# Patient Record
Sex: Male | Born: 1970 | Race: White | Hispanic: No | Marital: Married | Smoking: Never smoker
Health system: Southern US, Community
[De-identification: ages and names within clinical notes are randomized; demographics above are authoritative.]

## PROBLEM LIST (undated history)

## (undated) DIAGNOSIS — Z8719 Personal history of other diseases of the digestive system: Secondary | ICD-10-CM

## (undated) DIAGNOSIS — S83209A Unspecified tear of unspecified meniscus, current injury, unspecified knee, initial encounter: Secondary | ICD-10-CM

## (undated) DIAGNOSIS — Z87442 Personal history of urinary calculi: Secondary | ICD-10-CM

---
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
## 2010-11-09 ENCOUNTER — Emergency Department (HOSPITAL_COMMUNITY)
Admission: EM | Admit: 2010-11-09 | Discharge: 2010-11-09 | Disposition: A | Discharge status: Home / Self Care | Payer: Managed Care, Other (non HMO) | Attending: Emergency Medicine | Admitting: Emergency Medicine

## 2010-11-09 ENCOUNTER — Emergency Department (HOSPITAL_COMMUNITY): Payer: Managed Care, Other (non HMO)

## 2010-11-09 DIAGNOSIS — Z87442 Personal history of urinary calculi: Secondary | ICD-10-CM | POA: Insufficient documentation

## 2010-11-09 DIAGNOSIS — R109 Unspecified abdominal pain: Secondary | ICD-10-CM | POA: Insufficient documentation

## 2010-11-09 DIAGNOSIS — N200 Calculus of kidney: Secondary | ICD-10-CM | POA: Insufficient documentation

## 2010-11-09 DIAGNOSIS — R110 Nausea: Secondary | ICD-10-CM | POA: Insufficient documentation

## 2010-11-09 DIAGNOSIS — N201 Calculus of ureter: Secondary | ICD-10-CM | POA: Insufficient documentation

## 2010-11-09 LAB — URINALYSIS, ROUTINE W REFLEX MICROSCOPIC
Bilirubin Urine: NEGATIVE
Glucose, UA: NEGATIVE mg/dL
Hgb urine dipstick: NEGATIVE
Ketones, ur: NEGATIVE mg/dL
Nitrite: NEGATIVE
Protein, ur: NEGATIVE mg/dL
Specific Gravity, Urine: 1.027 (ref 1.005–1.030)
Urobilinogen, UA: 1 mg/dL (ref 0.0–1.0)
pH: 7.5 (ref 5.0–8.0)

## 2010-11-09 LAB — POCT I-STAT, CHEM 8
BUN: 22 mg/dL (ref 6–23)
Calcium, Ion: 1.18 mmol/L (ref 1.12–1.32)
Chloride: 105 meq/L (ref 96–112)
Creatinine, Ser: 1.5 mg/dL (ref 0.4–1.5)
Glucose, Bld: 89 mg/dL (ref 70–99)
HCT: 48 % (ref 39.0–52.0)
Hemoglobin: 16.3 g/dL (ref 13.0–17.0)
Potassium: 3.6 mEq/L (ref 3.5–5.1)
Sodium: 143 meq/L (ref 135–145)
TCO2: 26 mmol/L (ref 0–100)

## 2012-10-06 ENCOUNTER — Other Ambulatory Visit: Payer: Self-pay | Admitting: Surgical

## 2012-10-09 NOTE — H&P (Signed)
Robert Kline is an 42 y.o. male.   Chief Complaint: left knee pain HPI: The patient presented with left knee pain x 2 months. He reports that he injured it when he was [place] and felt his knee buckle on him. Since that time he's had pain with weight bearing, swelling, limited ROM and instability in the knee.He was initally treated with an economy hinge brace. He reports that he's not feeling much better unless he's sitting down and has the leg elevated. He's noticed some fluid on the knee. He says that when he puts weight on it, it does feel unstable like it might hyperextend. MRI showed he has a retear of his medial meniscus of the left knee.  Allergies No Known Drug Allergies.   Family History( Diabetes Mellitus. mother and grandmother mothers side Heart Disease. mother Heart disease in male family member before age 73 Hypertension. mother and grandmother mothers side Rheumatoid Arthritis. grandmother mothers side   Social History Alcohol use. current drinker; drinks beer, wine and hard liquor; only occasionally per week Children. 3 Exercise. Exercises rarely; does running / walking and other Illicit drug use. no Living situation. live with spouse Marital status. married Tobacco / smoke exposure. no Tobacco use. never smoker   Medication History No Current Medications.   Past Surgical History Arthroscopy of Knee. left  Past Medical History Kidney Stone   Review of Systems  Constitutional: Negative.   HENT: Negative.  Negative for neck pain.   Eyes: Negative.   Respiratory: Negative.   Cardiovascular: Negative.   Gastrointestinal: Negative.   Genitourinary: Negative.   Musculoskeletal: Positive for joint pain. Negative for myalgias, back pain and falls.       Left knee pain  Skin: Negative.   Neurological: Negative.   Endo/Heme/Allergies: Negative.   Psychiatric/Behavioral: Negative.     Physical Exam   Constitutional: He is oriented to person, place, and time. He appears well-developed and well-nourished. No distress.  HENT:  Head: Normocephalic and atraumatic.  Right Ear: External ear normal.  Left Ear: External ear normal.  Nose: Nose normal.  Mouth/Throat: Oropharynx is clear and moist.  Eyes: Conjunctivae and EOM are normal.  Neck: Normal range of motion. Neck supple. No tracheal deviation present. No thyromegaly present.  Cardiovascular: Normal rate, regular rhythm, normal heart sounds and intact distal pulses.   No murmur heard. Respiratory: Effort normal and breath sounds normal. No respiratory distress. He has no wheezes. He exhibits no tenderness.  GI: Soft. Bowel sounds are normal. He exhibits no distension and no mass. There is no tenderness.  Musculoskeletal:       Right hip: Normal.       Left hip: Normal.       Right knee: Normal.       Left knee: He exhibits decreased range of motion and effusion. He exhibits no erythema. Tenderness found. Medial joint line tenderness noted.       Right lower leg: He exhibits no tenderness and no swelling.       Left lower leg: He exhibits no tenderness and no swelling.  In the left knee, he does have a mild effusion. He is tender about the medial joint line and over the course of the LCL. He does have pain with McMurray's. On ROM, he can fully extend, however this is painful. He is hesitant to flex beyond 85 degrees. The calf is soft and nontender.  Lymphadenopathy:    He has no cervical adenopathy.  Neurological: He  is alert and oriented to person, place, and time. He has normal strength and normal reflexes. No sensory deficit.  Skin: No rash noted. He is not diaphoretic. No erythema.  Psychiatric: He has a normal mood and affect. His behavior is normal.     Assessment/Plan Medial meniscus tear, left knee He needs an arthroscopic medial meniscectomy of the left knee. At this point, the most predictable means of improving pain  and function is knee arthroscopy. The procedure, risks, potential complications and rehab course are discussed in detail and the patient elects to proceed. The goals of this procedure are decreased pain and increased function. There is a high liklihood that both of these goals will be achieved.   [name] LAUREN 10/09/2012, 11:29 AM

## 2012-10-11 ENCOUNTER — Encounter (HOSPITAL_BASED_OUTPATIENT_CLINIC_OR_DEPARTMENT_OTHER): Payer: Self-pay | Admitting: *Deleted

## 2012-10-11 NOTE — Progress Notes (Signed)
NPO AFTER MN. ARRIVES AT 1030. NEEDS HG. 

## 2012-10-15 ENCOUNTER — Ambulatory Visit (HOSPITAL_BASED_OUTPATIENT_CLINIC_OR_DEPARTMENT_OTHER): Payer: BC Managed Care – PPO | Admitting: Anesthesiology

## 2012-10-15 ENCOUNTER — Encounter (HOSPITAL_BASED_OUTPATIENT_CLINIC_OR_DEPARTMENT_OTHER)
Admission: RE | Disposition: A | Discharge status: Home / Self Care | Payer: Self-pay | Source: Ambulatory Visit | Attending: Orthopedic Surgery

## 2012-10-15 ENCOUNTER — Ambulatory Visit (HOSPITAL_BASED_OUTPATIENT_CLINIC_OR_DEPARTMENT_OTHER)
Admission: RE | Admit: 2012-10-15 | Discharge: 2012-10-15 | Disposition: A | Discharge status: Home / Self Care | Payer: BC Managed Care – PPO | Source: Ambulatory Visit | Attending: Orthopedic Surgery | Admitting: Orthopedic Surgery

## 2012-10-15 ENCOUNTER — Encounter (HOSPITAL_BASED_OUTPATIENT_CLINIC_OR_DEPARTMENT_OTHER): Payer: Self-pay | Admitting: *Deleted

## 2012-10-15 ENCOUNTER — Encounter (HOSPITAL_BASED_OUTPATIENT_CLINIC_OR_DEPARTMENT_OTHER): Payer: Self-pay | Admitting: Anesthesiology

## 2012-10-15 DIAGNOSIS — IMO0002 Reserved for concepts with insufficient information to code with codable children: Secondary | ICD-10-CM | POA: Insufficient documentation

## 2012-10-15 DIAGNOSIS — X58XXXA Exposure to other specified factors, initial encounter: Secondary | ICD-10-CM | POA: Insufficient documentation

## 2012-10-15 DIAGNOSIS — M2240 Chondromalacia patellae, unspecified knee: Secondary | ICD-10-CM | POA: Insufficient documentation

## 2012-10-15 HISTORY — DX: Unspecified tear of unspecified meniscus, current injury, unspecified knee, initial encounter: S83.209A

## 2012-10-15 HISTORY — DX: Personal history of other diseases of the digestive system: Z87.19

## 2012-10-15 HISTORY — PX: KNEE ARTHROSCOPY WITH MEDIAL MENISECTOMY: SHX5651

## 2012-10-15 HISTORY — DX: Personal history of urinary calculi: Z87.442

## 2012-10-15 SURGERY — ARTHROSCOPY, KNEE, WITH MEDIAL MENISCECTOMY
Anesthesia: General | Site: Knee | Laterality: Left | Wound class: Clean

## 2012-10-15 MED ORDER — LACTATED RINGERS IV SOLN
INTRAVENOUS | Status: DC
  Filled 2012-10-15: qty 1000

## 2012-10-15 MED ORDER — PROPOFOL 10 MG/ML IV BOLUS
INTRAVENOUS | Status: DC | PRN
  Administered 2012-10-15: 350 mg via INTRAVENOUS

## 2012-10-15 MED ORDER — ACETAMINOPHEN 10 MG/ML IV SOLN
INTRAVENOUS | Status: DC | PRN
  Administered 2012-10-15: 1000 mg via INTRAVENOUS

## 2012-10-15 MED ORDER — CEFAZOLIN SODIUM-DEXTROSE 2-3 GM-% IV SOLR
2.0000 g | INTRAVENOUS | Status: AC
  Administered 2012-10-15: 2 g via INTRAVENOUS
  Filled 2012-10-15: qty 50

## 2012-10-15 MED ORDER — LIDOCAINE HCL (CARDIAC) 20 MG/ML IV SOLN
INTRAVENOUS | Status: DC | PRN
  Administered 2012-10-15: 80 mg via INTRAVENOUS

## 2012-10-15 MED ORDER — LACTATED RINGERS IV SOLN
INTRAVENOUS | Status: DC
  Administered 2012-10-15 (×2): via INTRAVENOUS
  Filled 2012-10-15: qty 1000

## 2012-10-15 MED ORDER — PROMETHAZINE HCL 25 MG/ML IJ SOLN
6.2500 mg | INTRAMUSCULAR | Status: DC | PRN
  Filled 2012-10-15: qty 1

## 2012-10-15 MED ORDER — ONDANSETRON HCL 4 MG/2ML IJ SOLN
INTRAMUSCULAR | Status: DC | PRN
  Administered 2012-10-15: 4 mg via INTRAVENOUS

## 2012-10-15 MED ORDER — DEXAMETHASONE SODIUM PHOSPHATE 4 MG/ML IJ SOLN
INTRAMUSCULAR | Status: DC | PRN
  Administered 2012-10-15: 10 mg via INTRAVENOUS

## 2012-10-15 MED ORDER — MEPERIDINE HCL 25 MG/ML IJ SOLN
6.2500 mg | INTRAMUSCULAR | Status: DC | PRN
  Filled 2012-10-15: qty 1

## 2012-10-15 MED ORDER — SODIUM CHLORIDE 0.9 % IR SOLN
Status: DC | PRN
  Administered 2012-10-15: 6000 mL

## 2012-10-15 MED ORDER — MIDAZOLAM HCL 5 MG/5ML IJ SOLN
INTRAMUSCULAR | Status: DC | PRN
  Administered 2012-10-15: 2 mg via INTRAVENOUS

## 2012-10-15 MED ORDER — KETOROLAC TROMETHAMINE 30 MG/ML IJ SOLN
INTRAMUSCULAR | Status: DC | PRN
  Administered 2012-10-15: 30 mg via INTRAVENOUS

## 2012-10-15 MED ORDER — BUPIVACAINE-EPINEPHRINE 0.25% -1:200000 IJ SOLN
INTRAMUSCULAR | Status: DC | PRN
  Administered 2012-10-15: 30 mL

## 2012-10-15 MED ORDER — FENTANYL CITRATE 0.05 MG/ML IJ SOLN
25.0000 ug | INTRAMUSCULAR | Status: DC | PRN
  Filled 2012-10-15: qty 1

## 2012-10-15 MED ORDER — BACITRACIN-NEOMYCIN-POLYMYXIN 400-5-5000 EX OINT
TOPICAL_OINTMENT | CUTANEOUS | Status: DC | PRN
  Administered 2012-10-15: 1 via TOPICAL

## 2012-10-15 MED ORDER — OXYCODONE-ACETAMINOPHEN 10-325 MG PO TABS: 1.0000 | ORAL_TABLET | ORAL | Status: AC | PRN

## 2012-10-15 MED ORDER — FENTANYL CITRATE 0.05 MG/ML IJ SOLN
INTRAMUSCULAR | Status: DC | PRN
  Administered 2012-10-15 (×3): 50 ug via INTRAVENOUS

## 2012-10-15 MED ORDER — CHLORHEXIDINE GLUCONATE 4 % EX LIQD
60.0000 mL | Freq: Once | CUTANEOUS | Status: DC
  Filled 2012-10-15: qty 60

## 2012-10-15 SURGICAL SUPPLY — 41 items
BANDAGE ELASTIC 4 VELCRO ST LF (GAUZE/BANDAGES/DRESSINGS) ×2 IMPLANT
BANDAGE ELASTIC 6 VELCRO ST LF (GAUZE/BANDAGES/DRESSINGS) ×2 IMPLANT
BLADE CUDA 5.5 (BLADE) IMPLANT
BLADE CUTTER GATOR 3.5 (BLADE) IMPLANT
BLADE CUTTER MENIS 5.5 (BLADE) IMPLANT
BLADE GREAT WHITE 4.2 (BLADE) ×2 IMPLANT
BLADE SURG 15 STRL LF DISP TIS (BLADE) IMPLANT
BLADE SURG 15 STRL SS (BLADE)
BNDG COHESIVE 4X5 TAN NS LF (GAUZE/BANDAGES/DRESSINGS) ×2 IMPLANT
CANISTER SUCT LVC 12 LTR MEDI- (MISCELLANEOUS) ×2 IMPLANT
CANISTER SUCTION 2500CC (MISCELLANEOUS) ×2 IMPLANT
CLOTH BEACON ORANGE TIMEOUT ST (SAFETY) ×2 IMPLANT
DRAPE ARTHROSCOPY W/POUCH 114 (DRAPES) ×2 IMPLANT
DRAPE LG THREE QUARTER DISP (DRAPES) ×2 IMPLANT
DRSG EMULSION OIL 3X3 NADH (GAUZE/BANDAGES/DRESSINGS) ×2 IMPLANT
DRSG PAD ABDOMINAL 8X10 ST (GAUZE/BANDAGES/DRESSINGS) ×2 IMPLANT
DURAPREP 26ML APPLICATOR (WOUND CARE) ×2 IMPLANT
ELECT MENISCUS 165MM 90D (ELECTRODE) IMPLANT
ELECT REM PT RETURN 9FT ADLT (ELECTROSURGICAL)
ELECTRODE REM PT RTRN 9FT ADLT (ELECTROSURGICAL) IMPLANT
GAUZE SPONGE 4X4 12PLY STRL LF (GAUZE/BANDAGES/DRESSINGS) ×2 IMPLANT
GLOVE ECLIPSE 8.0 STRL XLNG CF (GLOVE) ×2 IMPLANT
GLOVE INDICATOR 8.5 STRL (GLOVE) ×4 IMPLANT
GLOVE SURG ORTHO 6.0 STRL STRW (GLOVE) ×2 IMPLANT
GOWN PREVENTION PLUS LG XLONG (DISPOSABLE) ×2 IMPLANT
GOWN STRL REIN XL XLG (GOWN DISPOSABLE) ×2 IMPLANT
KNEE WRAP E Z 3 GEL PACK (MISCELLANEOUS) ×2 IMPLANT
MINI VAC (SURGICAL WAND) ×2 IMPLANT
PACK ARTHROSCOPY DSU (CUSTOM PROCEDURE TRAY) ×2 IMPLANT
PACK BASIN DAY SURGERY FS (CUSTOM PROCEDURE TRAY) ×2 IMPLANT
PADDING CAST COTTON 6X4 STRL (CAST SUPPLIES) ×2 IMPLANT
PENCIL BUTTON HOLSTER BLD 10FT (ELECTRODE) IMPLANT
SET ARTHROSCOPY TUBING (MISCELLANEOUS) ×1
SET ARTHROSCOPY TUBING LN (MISCELLANEOUS) ×1 IMPLANT
SPONGE GAUZE 4X4 12PLY (GAUZE/BANDAGES/DRESSINGS) ×2 IMPLANT
SPONGE SURGIFOAM ABS GEL 12-7 (HEMOSTASIS) IMPLANT
SUT ETHILON 3 0 PS 1 (SUTURE) ×2 IMPLANT
SYR CONTROL 10ML LL (SYRINGE) IMPLANT
TOWEL OR 17X24 6PK STRL BLUE (TOWEL DISPOSABLE) ×2 IMPLANT
WAND 90 DEG TURBOVAC W/CORD (SURGICAL WAND) IMPLANT
WATER STERILE IRR 500ML POUR (IV SOLUTION) ×2 IMPLANT

## 2012-10-15 NOTE — Anesthesia Preprocedure Evaluation (Addendum)
Anesthesia Evaluation  Patient identified by MRN, date of birth, ID band Patient awake    Reviewed: Allergy & Precautions, H&P , NPO status , Patient's Chart, lab work & pertinent test results  Airway Mallampati: II TM Distance: >3 FB Neck ROM: full    Dental no notable dental hx.    Pulmonary neg pulmonary ROS,  breath sounds clear to auscultation  Pulmonary exam normal       Cardiovascular Exercise Tolerance: Good negative cardio ROS  Rhythm:regular Rate:Normal     Neuro/Psych negative neurological ROS  negative psych ROS   GI/Hepatic negative GI ROS, Neg liver ROS,   Endo/Other  negative endocrine ROS  Renal/GU negative Renal ROS  negative genitourinary   Musculoskeletal   Abdominal   Peds  Hematology negative hematology ROS (+)   Anesthesia Other Findings   Reproductive/Obstetrics negative OB ROS                           Anesthesia Physical Anesthesia Plan  ASA: II  Anesthesia Plan: General and General LMA   Post-op Pain Management:    Induction:   Airway Management Planned:   Additional Equipment:   Intra-op Plan:   Post-operative Plan:   Informed Consent: I have reviewed the patients History and Physical, chart, labs and discussed the procedure including the risks, benefits and alternatives for the proposed anesthesia with the patient or authorized representative who has indicated his/her understanding and acceptance.   Dental Advisory Given  Plan Discussed with: CRNA  Anesthesia Plan Comments:         Anesthesia Quick Evaluation

## 2012-10-15 NOTE — H&P (View-Only) (Signed)
NPO AFTER MN. ARRIVES AT 1030. NEEDS HG. 

## 2012-10-15 NOTE — Brief Op Note (Signed)
10/15/2012  2:02 PM  PATIENT:  Robert Kline  42 y.o. 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  PROCEDURE:  Procedure(s): KNEE ARTHROSCOPY WITH MEDIAL MENISECTOMY ABRASION CHONDROPLASTY OF PATELLA AND MEDIAL FEMORAL CHONDYLE (Left) and Microfracture Technique of Medial Femoral Condyle.  SURGEON:  Surgeon(s) and Role:    * Jacki Cones, MD - Primary  :   ASSISTANTS:OR Tech.   ANESTHESIA:   general  EBL:  Total I/O In: 1000 [I.V.:1000] Out: -   BLOOD ADMINISTERED:none  DRAINS: none   LOCAL MEDICATIONS USED:  MARCAINE 30cc of 0.25% Marcaine with Epinephrine.    SPECIMEN:  No Specimen  DISPOSITION OF SPECIMEN:  N/A  COUNTS:  YES  TOURNIQUET:  * No tourniquets in log *  DICTATION: .Other Dictation: Dictation Number [id number]  PLAN OF CARE: Discharge to home after PACU  PATIENT DISPOSITION:  PACU - hemodynamically stable.   Delay start of Pharmacological VTE agent (>24hrs) due to surgical blood loss or risk of bleeding: yes

## 2012-10-15 NOTE — Interval H&P Note (Signed)
History and Physical Interval Note:  10/15/2012 1:01 PM  Robert Kline  has presented today for surgery, with the diagnosis of Left Medial Meniscus Tear  The various methods of treatment have been discussed with the patient and family. After consideration of risks, benefits and other options for treatment, the patient has consented to  Procedure(s): KNEE ARTHROSCOPY WITH MEDIAL MENISECTOMY (Left) as a surgical intervention .  The patient's history has been reviewed, patient examined, no change in status, stable for surgery.  I have reviewed the patient's chart and labs.  Questions were answered to the patient's satisfaction.     Ali Mohl A

## 2012-10-15 NOTE — Transfer of Care (Signed)
Immediate Anesthesia Transfer of Care Note  Patient: Robert Kline  Procedure(s) Performed: Procedure(s) (LRB): KNEE ARTHROSCOPY WITH MEDIAL MENISECTOMY ABRASION CHONDROPLASTY OF PATELLA AND MEDIAL FEMORAL CHONDYLE (Left)  Patient Location: PACU  Anesthesia Type: General  Level of Consciousness: drowsy, opens eyes to command  Airway & Oxygen Therapy: Patient Spontanous Breathing and Patient connected to face mask oxygen  Post-op Assessment: Report given to PACU RN and Post -op Vital signs reviewed and stable  Post vital signs: Reviewed and stable  Complications: No apparent anesthesia complications

## 2012-10-16 NOTE — Op Note (Signed)
Robert, Kline                  ACCOUNT NO.:  [id number]  MEDICAL RECORD NO.:  [id number]  LOCATION:                                 FACILITY:  PHYSICIAN:  Georges Lynch. Hildy Nicholl, M.D.DATE OF BIRTH:  1971/04/21  DATE OF PROCEDURE:  10/15/2012 DATE OF DISCHARGE:                              OPERATIVE REPORT   SURGEON:  Georges Lynch. Alyah Boehning, M.D.  ASSISTANT:  Orthopedic operating room technician.  PREOPERATIVE DIAGNOSES: 1. Chondromalacia of the left knee. 2. Torn medial meniscus, left knee.  POSTOPERATIVE DIAGNOSES: 1. Chondromalacia of the left knee. 2. Torn medial meniscus, left knee.  OPERATION: 1. Diagnostic arthroscopy of the left knee. 2. Partial medial meniscectomy of the left knee. 3. Abrasion of chondroplasty of patella left knee. 4. Abrasion of chondroplasty, medial femoral condyle, left knee. 5. Microfracture technique medial femoral condyle, left knee.  PROCEDURE IN DETAIL:  Under general anesthesia, routine orthopedic prep and draping of the left lower extremity was carried out.  The left lower extremity was placed in the appropriate knee holder.  The patient had 1 g of IV Ancef.  At this time, the appropriate time-out was carried out before we made any incisions.  I also marked the appropriate left leg in the holding area.  A small punctate incision was made in suprapatellar pouch.  Inflow cannula was inserted.  Knee was distended with saline. Another small punctate incision was made in the anterolateral joint. The arthroscope was entered from the lateral approach and a complete diagnostic arthroscopy was carried out.  I went up in the suprapatellar pouch.  He had severe chronic synovitis and patellofemoral arthritis.  I then performed an abrasion chondroplasty of the patella at this time, as well as during the synovectomy.  I then went down the lateral joint. There was a small loose piece of cartilage floating about very small and I simply utilized the  irrigation technique to remove that and also utilized the shaver suction device.  The lateral meniscus intact. Cruciates were intact and over the medial joint, with the main highlight, main problem what he had obvious chondromalacia of the tibial plateau, medially as well at the femoral condyle.  He had a large defect in the femoral condyle partial thickness.  I utilized the shaver suction device, did a partial abrasion chondroplasty to just remove the loose pieces of cartilage that were hanging off the bone.  I then utilized a microfracture technique in the usual fashion.  I thoroughly irrigated that area out.  I also did a partial medial meniscectomy of the posterior horn.  The knee was reinspected.  There were no other abnormalities.  No loose bodies.  I removed all the fluid, closed all 3 punctate incisions with 3-0 nylon suture.  I injected 30 mL of 0.25% Marcaine with epinephrine into the knee joint and a sterile Neosporin dressing was applied.  The patient had 30 mg of Toradol at the end of the procedure.          ______________________________ Georges Lynch. Darrelyn Hillock, M.D.     RAG/MEDQ  D:  10/15/2012  T:  10/16/2012  Job:  [id number]

## 2012-10-18 ENCOUNTER — Encounter (HOSPITAL_BASED_OUTPATIENT_CLINIC_OR_DEPARTMENT_OTHER): Payer: Self-pay | Admitting: Orthopedic Surgery

## 2012-10-21 NOTE — Anesthesia Postprocedure Evaluation (Signed)
  Anesthesia Post-op Note  Patient: Robert Kline  Procedure(s) Performed: Procedure(s) (LRB): KNEE ARTHROSCOPY WITH MEDIAL MENISECTOMY ABRASION CHONDROPLASTY OF PATELLA AND MEDIAL FEMORAL CHONDYLE (Left)  Patient Location: PACU  Anesthesia Type: General  Level of Consciousness: awake and alert   Airway and Oxygen Therapy: Patient Spontanous Breathing  Post-op Pain: mild  Post-op Assessment: Post-op Vital signs reviewed, Patient's Cardiovascular Status Stable, Respiratory Function Stable, Patent Airway and No signs of Nausea or vomiting  Last Vitals:  Filed Vitals:   10/15/12 1517  BP: 123/68  Pulse:   Temp: 36.2 C  Resp: 18    Post-op Vital Signs: stable   Complications: No apparent anesthesia complications

## 2016-11-18 ENCOUNTER — Emergency Department (HOSPITAL_COMMUNITY): Payer: BC Managed Care – PPO

## 2016-11-18 ENCOUNTER — Emergency Department (HOSPITAL_COMMUNITY)
Admission: EM | Admit: 2016-11-18 | Discharge: 2016-11-18 | Disposition: A | Discharge status: Home / Self Care | Payer: BC Managed Care – PPO | Attending: Emergency Medicine | Admitting: Emergency Medicine

## 2016-11-18 ENCOUNTER — Encounter (HOSPITAL_COMMUNITY): Payer: Self-pay

## 2016-11-18 DIAGNOSIS — R030 Elevated blood-pressure reading, without diagnosis of hypertension: Secondary | ICD-10-CM

## 2016-11-18 DIAGNOSIS — S060X0A Concussion without loss of consciousness, initial encounter: Secondary | ICD-10-CM | POA: Diagnosis not present

## 2016-11-18 DIAGNOSIS — S0990XA Unspecified injury of head, initial encounter: Secondary | ICD-10-CM | POA: Diagnosis present

## 2016-11-18 DIAGNOSIS — Y999 Unspecified external cause status: Secondary | ICD-10-CM | POA: Diagnosis not present

## 2016-11-18 DIAGNOSIS — Y939 Activity, unspecified: Secondary | ICD-10-CM | POA: Insufficient documentation

## 2016-11-18 DIAGNOSIS — Y929 Unspecified place or not applicable: Secondary | ICD-10-CM | POA: Diagnosis not present

## 2016-11-18 DIAGNOSIS — W228XXA Striking against or struck by other objects, initial encounter: Secondary | ICD-10-CM | POA: Diagnosis not present

## 2016-11-18 MED ORDER — OXYCODONE-ACETAMINOPHEN 5-325 MG PO TABS
2.0000 | ORAL_TABLET | Freq: Once | ORAL | Status: AC
  Administered 2016-11-18: 2 via ORAL
  Filled 2016-11-18: qty 2
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
## 2016-11-18 NOTE — ED Notes (Signed)
Patient transported to CT 

## 2018-10-01 DIAGNOSIS — R739 Hyperglycemia, unspecified: Secondary | ICD-10-CM | POA: Diagnosis not present

## 2018-10-01 DIAGNOSIS — Z79899 Other long term (current) drug therapy: Secondary | ICD-10-CM | POA: Diagnosis not present

## 2018-10-29 DIAGNOSIS — Z79899 Other long term (current) drug therapy: Secondary | ICD-10-CM | POA: Diagnosis not present

## 2018-10-29 DIAGNOSIS — E119 Type 2 diabetes mellitus without complications: Secondary | ICD-10-CM | POA: Diagnosis not present

## 2018-11-22 IMAGING — CT CT HEAD W/O CM
3 series · 15 of 47 positions shown, 18 images · non-contrast
Comparison: None.

CLINICAL DATA: 45-year-old male with injury last week left-side of
head now with headaches. Initial encounter.

EXAM:
CT HEAD WITHOUT CONTRAST
TECHNIQUE: Contiguous axial images were obtained from the base of the skull
through the vertex without intravenous contrast.

[Series 3: head 5.0 h30s · axial · 0.43mm/px · z∈[+1007,+1152]mm · 9 of 35 slices shown, 12 images]
[im 3/35  brain]
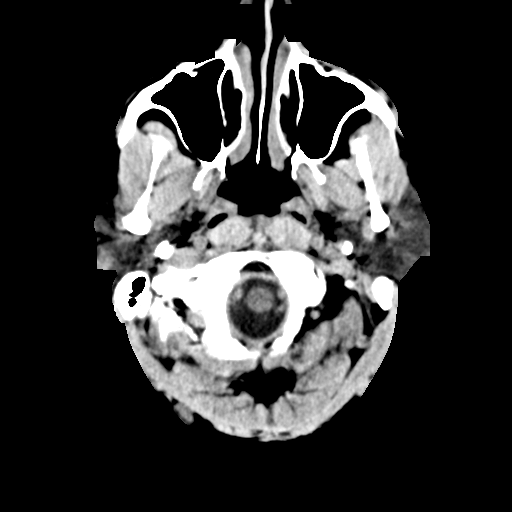
[im 3/35  bone]
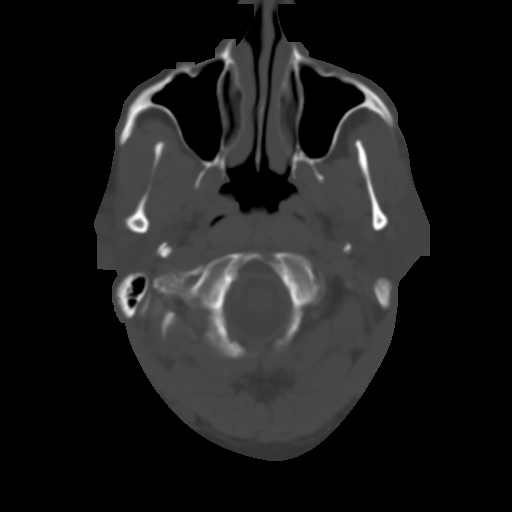
[im 6/35  brain]
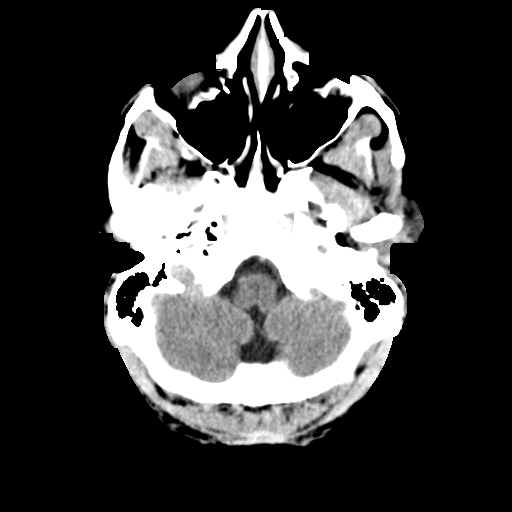
[im 10/35  brain]
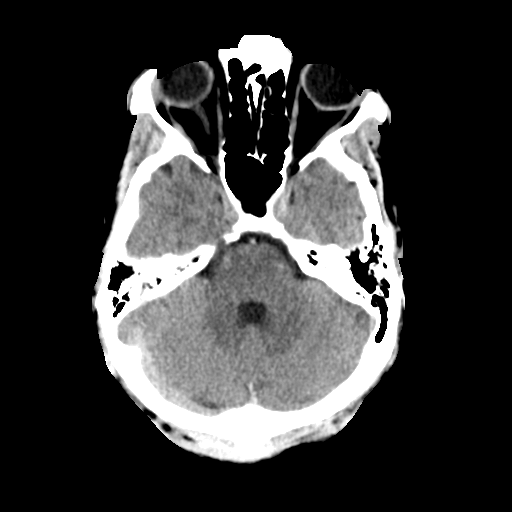
[im 13/35  brain]
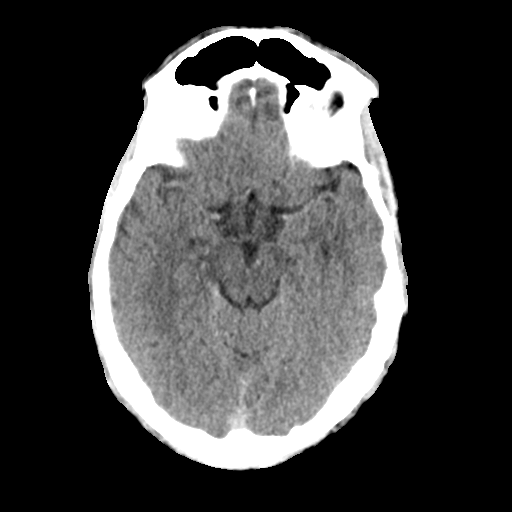
[im 18/35  brain]
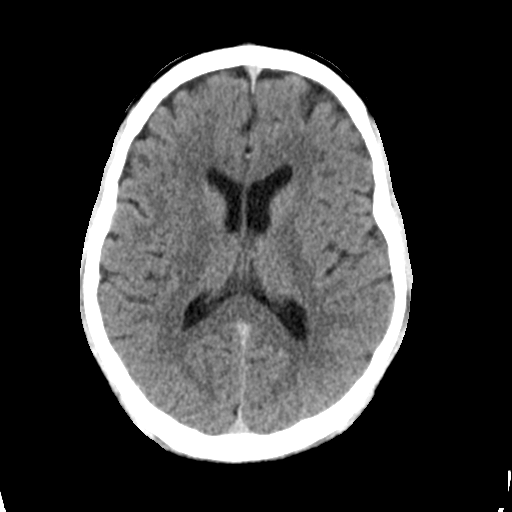
[im 18/35  bone]
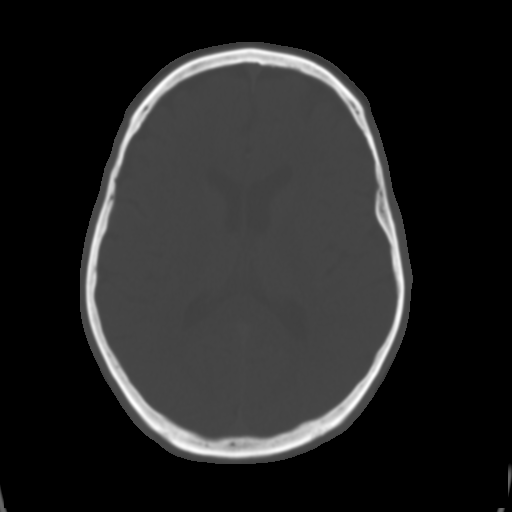
[im 22/35  brain]
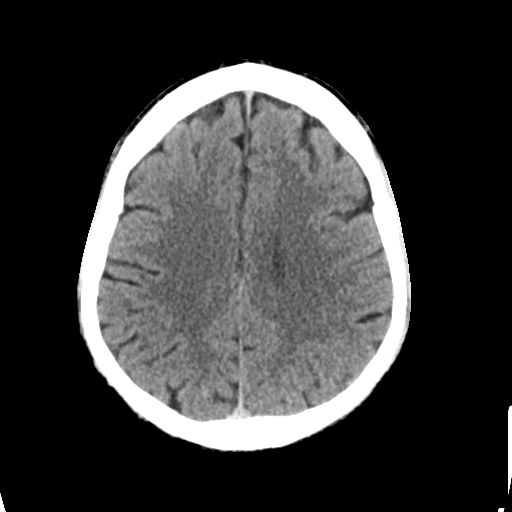
[im 25/35  brain]
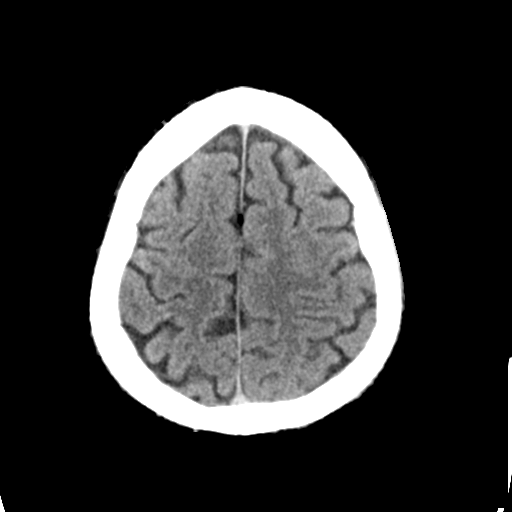
[im 29/35  brain]
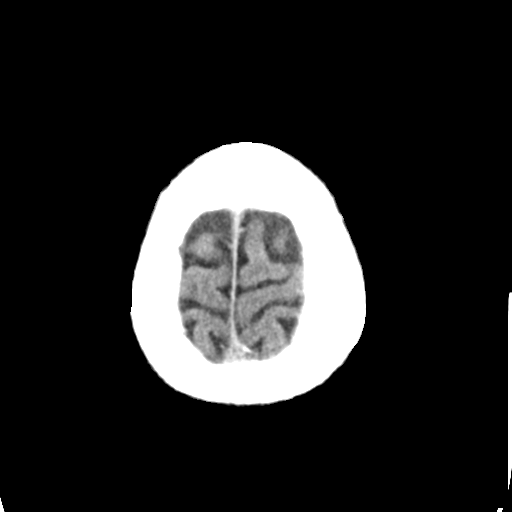
[im 32/35  brain]
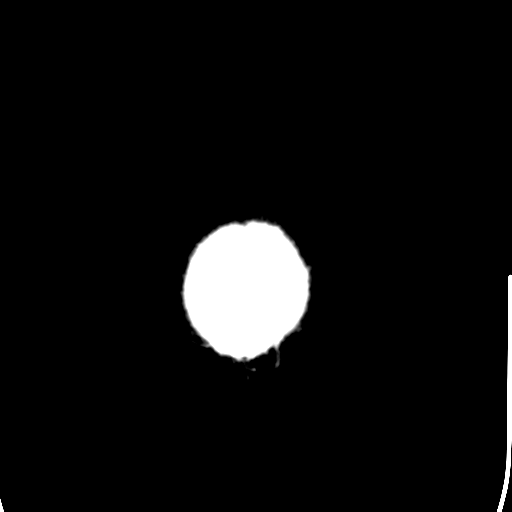
[im 32/35  bone]
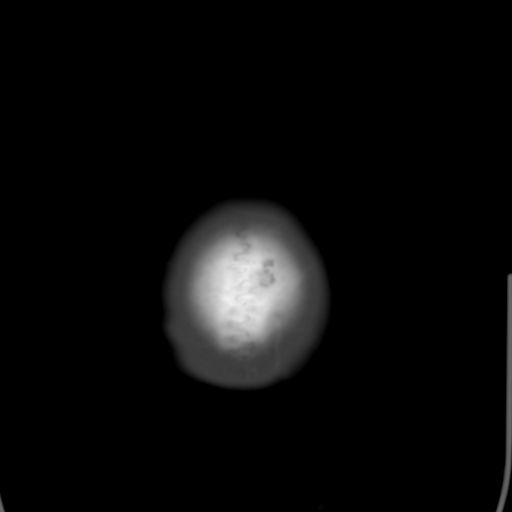

[Series 5: head 3.0 mpr cor · coronal · 0.34mm/px · 3 of 71 slices shown]
[im 24/71  brain]
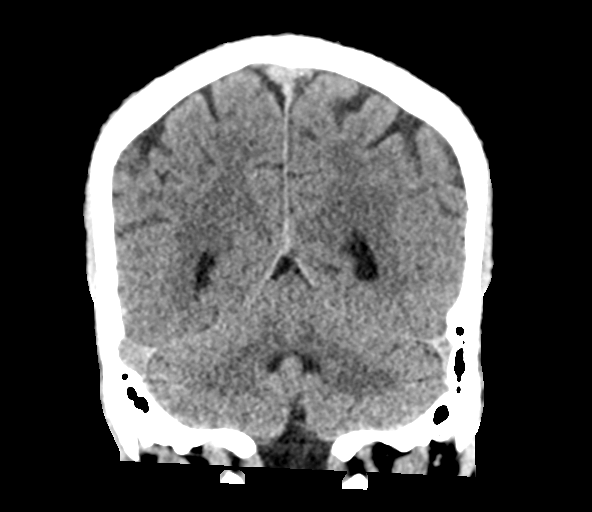
[im 32/71  brain]
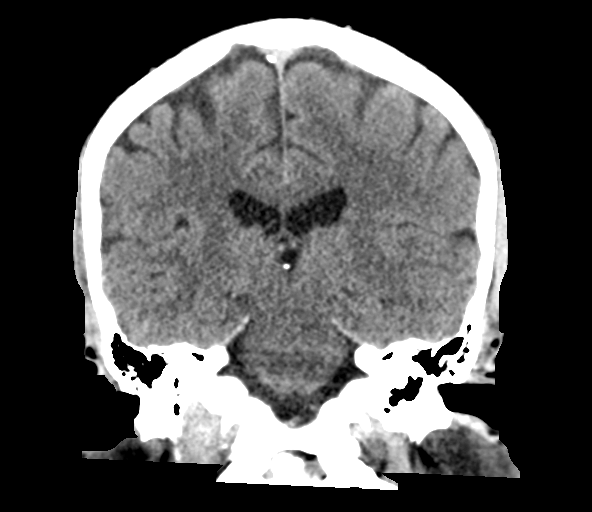
[im 39/71  brain]
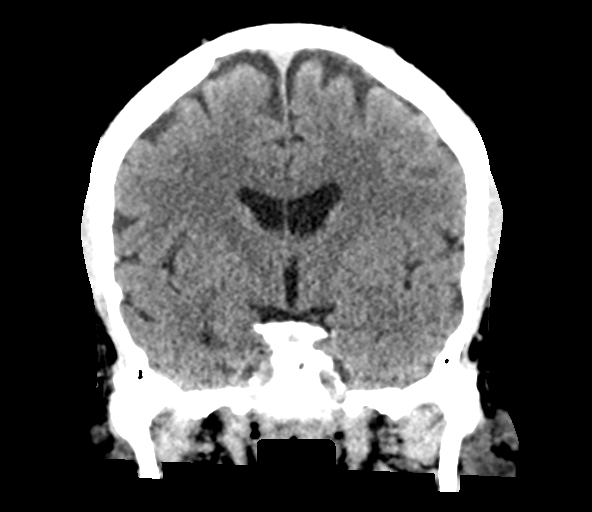

[Series 6: head 3.0 mpr sag · sagittal · 0.35mm/px · 3 of 62 slices shown]
[im 21/62  brain]
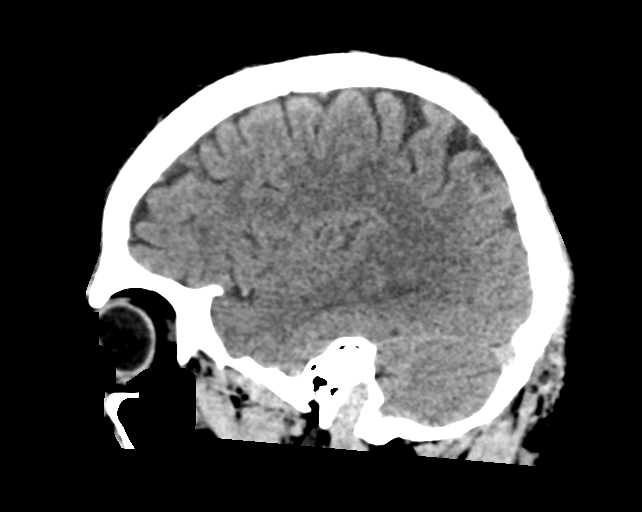
[im 31/62  brain]
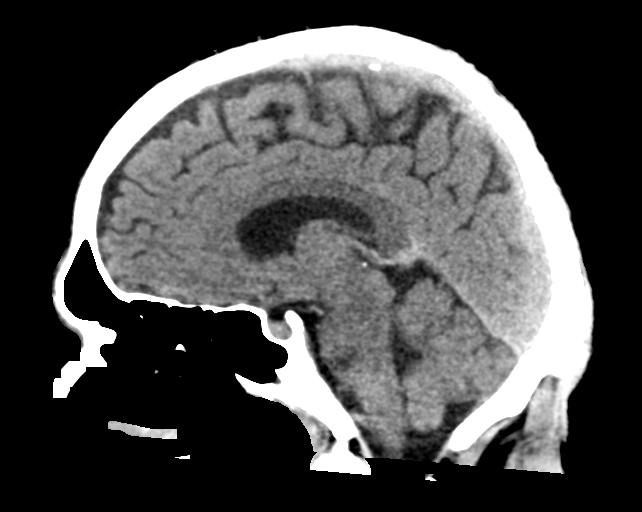
[im 41/62  brain]
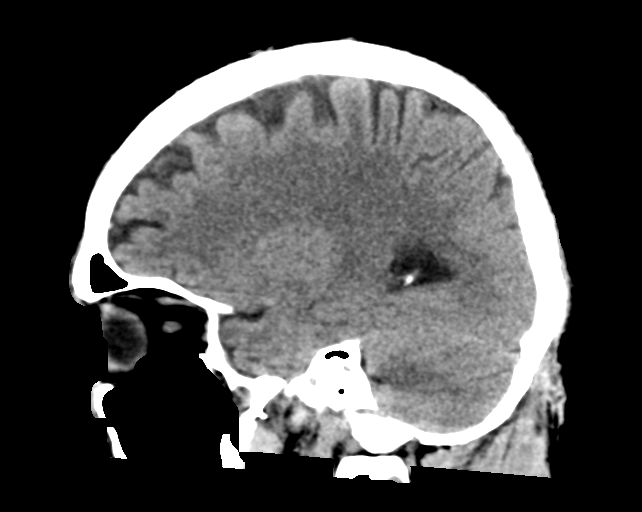

[15 of 47 positions shown; findings below may reference images not displayed]

FINDINGS: Brain: No intracranial hemorrhage or CT evidence of large acute
infarct.

No intracranial mass lesion noted on this unenhanced exam.

Vascular: No acute abnormality

Skull: No skull fracture.

Sinuses/Orbits: No acute orbital abnormality. Remote fracture medial
wall right orbit versus congenital dehiscence. Mild mucosal
thickening ethmoid sinus air cells. Minimal mucosal thickening right
sphenoid sinus air cell.

Other: Negative
IMPRESSION: No skull fracture or intracranial hemorrhage.

Mild mucosal thickening ethmoid sinus air cells. Minimal mucosal
thickening right sphenoid sinus air cell.

## 2019-03-02 DIAGNOSIS — S8391XA Sprain of unspecified site of right knee, initial encounter: Secondary | ICD-10-CM | POA: Diagnosis not present

## 2019-03-08 DIAGNOSIS — E119 Type 2 diabetes mellitus without complications: Secondary | ICD-10-CM | POA: Diagnosis not present

## 2019-03-08 DIAGNOSIS — M25561 Pain in right knee: Secondary | ICD-10-CM | POA: Diagnosis not present

## 2019-03-16 DIAGNOSIS — M25461 Effusion, right knee: Secondary | ICD-10-CM | POA: Diagnosis not present

## 2019-03-16 DIAGNOSIS — M25561 Pain in right knee: Secondary | ICD-10-CM | POA: Diagnosis not present
